# Patient Record
Sex: Female | Born: 1995 | Race: Black or African American | Hispanic: No | Marital: Single | Smoking: Never smoker
Health system: Southern US, Community
[De-identification: ages and names within clinical notes are randomized; demographics above are authoritative.]

---

## 2016-09-25 ENCOUNTER — Emergency Department (HOSPITAL_COMMUNITY)
Admission: EM | Admit: 2016-09-25 | Discharge: 2016-09-25 | Disposition: A | Discharge status: Home / Self Care | Payer: BLUE CROSS/BLUE SHIELD | Attending: Emergency Medicine | Admitting: Emergency Medicine

## 2016-09-25 ENCOUNTER — Encounter (HOSPITAL_COMMUNITY): Payer: Self-pay

## 2016-09-25 DIAGNOSIS — R519 Headache, unspecified: Secondary | ICD-10-CM

## 2016-09-25 DIAGNOSIS — Z79899 Other long term (current) drug therapy: Secondary | ICD-10-CM | POA: Insufficient documentation

## 2016-09-25 DIAGNOSIS — R52 Pain, unspecified: Secondary | ICD-10-CM | POA: Diagnosis present

## 2016-09-25 DIAGNOSIS — R51 Headache: Secondary | ICD-10-CM

## 2016-09-25 LAB — URINALYSIS, ROUTINE W REFLEX MICROSCOPIC
Bilirubin Urine: NEGATIVE
Glucose, UA: NEGATIVE mg/dL
KETONES UR: NEGATIVE mg/dL
LEUKOCYTES UA: NEGATIVE
Nitrite: NEGATIVE
Protein, ur: 30 mg/dL — AB
Specific Gravity, Urine: 1.029 (ref 1.005–1.030)
pH: 5 (ref 5.0–8.0)

## 2016-09-25 LAB — POC URINE PREG, ED: PREG TEST UR: NEGATIVE

## 2016-09-25 MED ORDER — LORAZEPAM 2 MG/ML IJ SOLN
1.0000 mg | Freq: Once | INTRAMUSCULAR | Status: AC
  Administered 2016-09-25: 1 mg via INTRAVENOUS
  Filled 2016-09-25: qty 1

## 2016-09-25 MED ORDER — DIPHENHYDRAMINE HCL 50 MG/ML IJ SOLN
25.0000 mg | Freq: Once | INTRAMUSCULAR | Status: AC
  Administered 2016-09-25: 25 mg via INTRAVENOUS
  Filled 2016-09-25: qty 1

## 2016-09-25 MED ORDER — SODIUM CHLORIDE 0.9 % IV BOLUS (SEPSIS)
1000.0000 mL | Freq: Once | INTRAVENOUS | Status: AC
  Administered 2016-09-25: 1000 mL via INTRAVENOUS

## 2016-09-25 MED ORDER — DEXAMETHASONE SODIUM PHOSPHATE 10 MG/ML IJ SOLN
10.0000 mg | Freq: Once | INTRAMUSCULAR | Status: AC
  Administered 2016-09-25: 10 mg via INTRAVENOUS
  Filled 2016-09-25: qty 1

## 2016-09-25 MED ORDER — PROCHLORPERAZINE EDISYLATE 5 MG/ML IJ SOLN
10.0000 mg | Freq: Once | INTRAMUSCULAR | Status: AC
  Administered 2016-09-25: 10 mg via INTRAVENOUS
  Filled 2016-09-25: qty 2

## 2016-09-25 NOTE — ED Provider Notes (Signed)
[facility]-EMERGENCY DEPT Provider Note   CSN: [id number] Arrival date & time: 09/25/16  1742     History   Chief Complaint Chief Complaint  Patient presents with  . Headache    HPI Kara Taylor is a 21 y.o. female.  The history is provided by the patient, a significant other and medical records.  Headache   This is a recurrent problem. The current episode started 2 days ago. The problem occurs constantly. The problem has not changed since onset.The headache is associated with the menstrual cycle, bright light and loud noise. The pain is located in the temporal region. The quality of the pain is described as dull. The pain is at a severity of 9/10. The pain is severe. The pain does not radiate. Pertinent negatives include no fever, no malaise/fatigue, no chest pressure, no near-syncope, no palpitations, no syncope, no shortness of breath, no nausea and no vomiting. She has tried NSAIDs for the symptoms. The treatment provided no relief.    History reviewed. No pertinent past medical history.  There are no active problems to display for this patient.   History reviewed. No pertinent surgical history.  OB History    No data available       Home Medications    Prior to Admission medications   Medication Sig Start Date End Date Taking? Authorizing Provider  ibuprofen (ADVIL,MOTRIN) 800 MG tablet Take 800 mg by mouth 2 (two) times daily as needed for headache. 07/09/16  Yes [provider]    Family History History reviewed. No pertinent family history.  Social History Social History  Substance Use Topics  . Smoking status: Never Smoker  . Smokeless tobacco: Never Used  . Alcohol use No     Allergies   Patient has no known allergies.   Review of Systems Review of Systems  Constitutional: Negative for activity change, chills, diaphoresis, fatigue, fever and malaise/fatigue.  HENT: Negative for congestion and rhinorrhea.   Eyes: Positive for photophobia.  Negative for visual disturbance.  Respiratory: Negative for cough, chest tightness, shortness of breath and stridor.   Cardiovascular: Negative for chest pain, palpitations, leg swelling, syncope and near-syncope.  Gastrointestinal: Negative for abdominal distention, abdominal pain, constipation, diarrhea, nausea and vomiting.  Genitourinary: Negative for difficulty urinating, dysuria, flank pain, frequency, hematuria, menstrual problem, pelvic pain, vaginal bleeding and vaginal discharge.  Musculoskeletal: Negative for back pain and neck pain.  Skin: Negative for rash and wound.  Neurological: Positive for headaches. Negative for dizziness, weakness, light-headedness and numbness.  Psychiatric/Behavioral: Negative for agitation and confusion.  All other systems reviewed and are negative.    Physical Exam Updated Vital Signs BP 131/85 (BP Location: Left Arm)   Pulse 98   Temp 98.7 F (37.1 C) (Oral)   Resp 18   LMP 09/18/2016   SpO2 100%   Physical Exam  Constitutional: She is oriented to person, place, and time. She appears well-developed and well-nourished. No distress.  HENT:  Head: Normocephalic and atraumatic.  Mouth/Throat: Oropharynx is clear and moist. No oropharyngeal exudate.  Eyes: Conjunctivae and EOM are normal. Pupils are equal, round, and reactive to light.  Neck: Normal range of motion. Neck supple.  Cardiovascular: Normal rate and regular rhythm.   No murmur heard. Pulmonary/Chest: Effort normal and breath sounds normal. No stridor. No respiratory distress. She has no wheezes. She exhibits no tenderness.  Abdominal: Soft. There is no tenderness.  Musculoskeletal: She exhibits no edema or tenderness.  Neurological: She is alert and oriented to person,  place, and time. She displays normal reflexes. No cranial nerve deficit or sensory deficit. She exhibits normal muscle tone. Coordination normal.  Skin: Skin is warm and dry. Capillary refill takes less than 2  seconds. No rash noted. She is not diaphoretic. No erythema.  Psychiatric: She has a normal mood and affect.  Nursing note and vitals reviewed.    ED Treatments / Results  Labs (all labs ordered are listed, but only abnormal results are displayed) Labs Reviewed  URINALYSIS, ROUTINE W REFLEX MICROSCOPIC - Abnormal; Notable for the following:       Result Value   Hgb urine dipstick SMALL (*)    Protein, ur 30 (*)    Bacteria, UA RARE (*)    Squamous Epithelial / LPF 0-5 (*)    All other components within normal limits  POC URINE PREG, ED    EKG  EKG Interpretation None       Radiology No results found.  Procedures Procedures (including critical care time)  Medications Ordered in ED Medications  sodium chloride 0.9 % bolus 1,000 mL (0 mLs Intravenous Stopped 09/25/16 2030)  prochlorperazine (COMPAZINE) injection 10 mg (10 mg Intravenous Given 09/25/16 1850)  diphenhydrAMINE (BENADRYL) injection 25 mg (25 mg Intravenous Given 09/25/16 1850)  dexamethasone (DECADRON) injection 10 mg (10 mg Intravenous Given 09/25/16 1850)  LORazepam (ATIVAN) injection 1 mg (1 mg Intravenous Given 09/25/16 1912)     Initial Impression / Assessment and Plan / ED Course  I have reviewed the triage vital signs and the nursing notes.  Pertinent labs & imaging results that were available during my care of the patient were reviewed by me and considered in my medical decision making (see chart for details).     Kara Taylor is a 21 y.o. female with a past medical history significant for headaches usually associated with menstrual cycles and presents with headache. Patient says that she is on the last day of her menstrual cycle currently. She reports that she has a headache for the last 3 days. She reports no nausea or vomiting. She describes the headache as 8 out of 10 severity and waxing and waning for the last 3 days. She denies any vision changes, palpitations, chest pain, shortness of breath.  She denies any fevers or chills. She denies any neck pain or neck stiffness. No recent head trauma. She denies any new medications aside from taking ibuprofen to help with her discomfort. She denies any weakness but does report some tingling in her extremity. She reports associated photophobia and phonophobia.  On exam, patient in no focal neurologic deficits. Pupils reactive bilaterally. Patient had no nuchal rigidity. Lungs were clear and abdomen nontender.  Suspect patient has migrainous headache causing her symptoms. Patient will have a pregnancy test and urinalysis. Patient will be given headache cocktail if preg test negative.   Reassessed after medications.             11:34 PM Patient reports that her headache is not completely resolved. Patient denies any other neurologic complaints. Patient is resting comfortable.  Pertinent test negative and urinalysis showed no evidence of infection.  Patient will be discharged with instructions to follow-up with PCP and return precautions. Patient understood this plan. Patient discharged in good condition with resolution of presenting symptoms.   Final Clinical Impressions(s) / ED Diagnoses   Final diagnoses:  Bad headache    New Prescriptions Discharge Medication List as of 09/25/2016 11:36 PM      Clinical Impression: 1. Bad headache  Disposition: Discharge  Condition: Good  I have discussed the results, Dx and Tx plan with the pt(& family if present). He/she/they expressed understanding and agree(s) with the plan. Discharge instructions discussed at great length. Strict return precautions discussed and pt &/or family have verbalized understanding of the instructions. No further questions at time of discharge.    Discharge Medication List as of 09/25/2016 11:36 PM      Follow Up: [facility] [address] [place] [id number] [phone] Schedule an appointment as soon  as possible for a visit    [facility] [address] [id number] [place] [phone]  If symptoms worsen     Zophia Marrone, Canary Brimhristopher J, MD 09/26/16 0010

## 2016-09-25 NOTE — ED Triage Notes (Signed)
Pt with headache x 3 days.  Now with tingling in arms and feet.  No gi symptoms.

## 2016-09-25 NOTE — Discharge Instructions (Signed)
Please get some rest. Please dehydrated. Please call and schedule appointment with primary care physician for further workup and management of her headaches. If any symptoms change or worsen, please return to the nearest emergency department.

## 2016-10-03 ENCOUNTER — Encounter (HOSPITAL_COMMUNITY): Payer: Self-pay

## 2016-10-03 ENCOUNTER — Emergency Department (HOSPITAL_COMMUNITY)
Admission: EM | Admit: 2016-10-03 | Discharge: 2016-10-03 | Disposition: A | Discharge status: Home / Self Care | Payer: BLUE CROSS/BLUE SHIELD | Attending: Emergency Medicine | Admitting: Emergency Medicine

## 2016-10-03 ENCOUNTER — Emergency Department (HOSPITAL_COMMUNITY): Payer: BLUE CROSS/BLUE SHIELD

## 2016-10-03 DIAGNOSIS — R0789 Other chest pain: Secondary | ICD-10-CM | POA: Diagnosis present

## 2016-10-03 DIAGNOSIS — Z79899 Other long term (current) drug therapy: Secondary | ICD-10-CM | POA: Diagnosis not present

## 2016-10-03 LAB — BASIC METABOLIC PANEL
ANION GAP: 7 (ref 5–15)
BUN: 9 mg/dL (ref 6–20)
CHLORIDE: 105 mmol/L (ref 101–111)
CO2: 26 mmol/L (ref 22–32)
Calcium: 9.4 mg/dL (ref 8.9–10.3)
Creatinine, Ser: 0.84 mg/dL (ref 0.44–1.00)
GFR calc Af Amer: >60 mL/min (ref >60)
GLUCOSE: 90 mg/dL (ref 65–99)
Potassium: 3.4 mmol/L — ABNORMAL LOW (ref 3.5–5.1)
Sodium: 138 mmol/L (ref 135–145)

## 2016-10-03 LAB — CBC
HEMATOCRIT: 34.3 % — AB (ref 36.0–46.0)
HEMOGLOBIN: 11.8 g/dL — AB (ref 12.0–15.0)
MCH: 29.4 pg (ref 26.0–34.0)
MCHC: 34.4 g/dL (ref 30.0–36.0)
MCV: 85.5 fL (ref 78.0–100.0)
Platelets: 262 10*3/uL (ref 150–400)
RBC: 4.01 MIL/uL (ref 3.87–5.11)
RDW: 13.9 % (ref 11.5–15.5)
WBC: 6.5 10*3/uL (ref 4.0–10.5)

## 2016-10-03 LAB — POCT PREGNANCY, URINE: Preg Test, Ur: NEGATIVE

## 2016-10-03 LAB — POCT I-STAT TROPONIN I: Troponin i, poc: 0 ng/mL (ref 0.00–0.08)

## 2016-10-03 LAB — D-DIMER, QUANTITATIVE (NOT AT ARMC)

## 2016-10-03 MED ORDER — KETOROLAC TROMETHAMINE 30 MG/ML IJ SOLN
15.0000 mg | Freq: Once | INTRAMUSCULAR | Status: AC
  Administered 2016-10-03: 15 mg via INTRAVENOUS
  Filled 2016-10-03: qty 1

## 2016-10-03 MED ORDER — GI COCKTAIL ~~LOC~~
30.0000 mL | Freq: Once | ORAL | Status: AC
  Administered 2016-10-03: 30 mL via ORAL
  Filled 2016-10-03: qty 30

## 2016-10-03 NOTE — ED Triage Notes (Signed)
Patient states she has had chest pain x 2 days and went to an UC. Patient states she was told she had mild hyperinflation of lungs. Patient wanted to be seen because she was continuing to have pain.

## 2016-10-03 NOTE — Discharge Instructions (Signed)
All of your lab work and imaging has been reassuring. No signs of blood clot or heart attack. May use over-the-counter acid reducer such as Prilosec or Nexium for heartburn. Please take NSAID such as ibuprofen and Aleve and Tylenol for pain. Failure constipation and he may use over-the-counter MiraLAX and stool softener.

## 2016-10-03 NOTE — ED Provider Notes (Signed)
[facility]-EMERGENCY DEPT Provider Note   CSN: [id number] Arrival date & time: 10/03/16  1239     History   Chief Complaint Chief Complaint  Patient presents with  . Chest Pain    HPI Kara Taylor is a 21 y.o. female.  HPI 21 year old African-American female with no significant past medical history presents to the emergency Department today with complaints of ongoing chest pain. Patient states that she has had ongoing "chest tightness" in the center of her chest that radiates to the left side for the past 2 days and has been constant. Nothing makes better or worse. Patient did not try anything for her symptoms at home. Went to urgent care yesterday and had a negative workup at that time. She was told she had mild hyperinflation of lungs and to follow-up with her PCP. Patient seen today for ongoing symptoms. She endorses mild shortness of breath that she cannot take a deep breath. Denies any pain with exertion. Nonpleuritic in nature. Patient states she is very anxious and family at bedside states the patient is stressed and anxious. Patient states she wants to know why her chest is hurting. States she didn't come in to the ED for evaluation of migraine approximately one week ago and was given migraine cocktail. Didn't know if this could be caused by this medications. Patient denies any cardiac history. She denies any history of PE/DVT, OCP use, lower extremity edema or calf tenderness, prolonged immobilizations, recent hospitalizations or surgeries. Patient denies any fever, recent illness, cough, lightheadedness, dizziness, nausea, vomiting, diaphoresis, urinary symptoms, paresthesias. History reviewed. No pertinent past medical history.  There are no active problems to display for this patient.   History reviewed. No pertinent surgical history.  OB History    No data available       Home Medications    Prior to Admission medications   Medication Sig Start Date End Date Taking?  Authorizing Provider  ibuprofen (ADVIL,MOTRIN) 800 MG tablet Take 800 mg by mouth 2 (two) times daily as needed for headache. 07/09/16   [provider]    Family History History reviewed. No pertinent family history.  Social History Social History  Substance Use Topics  . Smoking status: Never Smoker  . Smokeless tobacco: Never Used  . Alcohol use No     Allergies   Patient has no known allergies.   Review of Systems Review of Systems  Constitutional: Negative for chills and fever.  HENT: Negative for congestion.   Eyes: Negative for visual disturbance.  Respiratory: Positive for shortness of breath. Negative for cough.   Cardiovascular: Positive for chest pain. Negative for palpitations and leg swelling.  Gastrointestinal: Negative for abdominal pain, diarrhea, nausea and vomiting.  Genitourinary: Negative for dysuria, flank pain, frequency, hematuria and urgency.  Skin: Negative.   Neurological: Negative for dizziness, syncope, weakness, light-headedness and headaches.  Psychiatric/Behavioral: The patient is nervous/anxious.      Physical Exam Updated Vital Signs BP 131/86   Pulse 98   Temp 98.2 F (36.8 C) (Oral)   Resp 15   Ht 5' 5.5" (1.664 m)   Wt 56.2 kg (124 lb)   LMP 09/18/2016   SpO2 100%   BMI 20.32 kg/m   Physical Exam  Constitutional: She is oriented to person, place, and time. She appears well-developed and well-nourished. No distress.  Patient appears anxious but is nontoxic appearing. Appears in no acute distress resting on the bed comfortably.  HENT:  Head: Normocephalic and atraumatic.  Mouth/Throat: Oropharynx is clear  and moist.  Eyes: Conjunctivae are normal. Right eye exhibits no discharge. Left eye exhibits no discharge. No scleral icterus.  Neck: Normal range of motion. Neck supple. No thyromegaly present.  Cardiovascular: Regular rhythm, normal heart sounds and intact distal pulses.  Exam reveals no gallop and no friction rub.    No murmur heard. Hr 105  Pulmonary/Chest: Effort normal and breath sounds normal. No respiratory distress. She has no wheezes. She has no rales. She exhibits tenderness.  No hypoxia or tachpnea   Abdominal: Soft. Bowel sounds are normal. She exhibits no distension. There is no tenderness. There is no rebound and no guarding.  Musculoskeletal: Normal range of motion. She exhibits no edema.  No lower extremity edema or calf tenderness.  Lymphadenopathy:    She has no cervical adenopathy.  Neurological: She is alert and oriented to person, place, and time.  Skin: Skin is warm and dry. Capillary refill takes less than 2 seconds.  Nursing note and vitals reviewed.    ED Treatments / Results  Labs (all labs ordered are listed, but only abnormal results are displayed) Labs Reviewed  BASIC METABOLIC PANEL - Abnormal; Notable for the following:       Result Value   Potassium 3.4 (*)    All other components within normal limits  CBC - Abnormal; Notable for the following:    Hemoglobin 11.8 (*)    HCT 34.3 (*)    All other components within normal limits  D-DIMER, QUANTITATIVE (NOT AT [facility])  Rosezena SensorI-STAT TROPOININ, ED  POCT I-STAT TROPONIN I  POC URINE PREG, ED  POCT PREGNANCY, URINE    EKG  EKG Interpretation None       Radiology Dg Chest 2 View  Result Date: 10/03/2016 CLINICAL DATA:  Chest pain. EXAM: CHEST  2 VIEW COMPARISON:  None. FINDINGS: The heart size and mediastinal contours are within normal limits. Both lungs are clear. No pneumothorax or pleural effusion is noted. The visualized skeletal structures are unremarkable. IMPRESSION: No active cardiopulmonary disease. Electronically Signed   By: Lupita RaiderJames  Green Jr, M.D.   On: 10/03/2016 13:43    Procedures Procedures (including critical care time)  Medications Ordered in ED Medications  ketorolac (TORADOL) 30 MG/ML injection 15 mg (not administered)  gi cocktail (Maalox,Lidocaine,Donnatal) (not administered)      Initial Impression / Assessment and Plan / ED Course  I have reviewed the triage vital signs and the nursing notes.  Pertinent labs & imaging results that were available during my care of the patient were reviewed by me and considered in my medical decision making (see chart for details).     Patient is to be discharged with recommendation to follow up with PCP in regards to today's hospital visit. Chest pain is not likely of cardiac or pulmonary etiology d/t presentation, d dimer negative, VSS, no tracheal deviation, no JVD or new murmur, RRR, breath sounds equal bilaterally, EKG without signs of ishcemia, negative troponin, and negative CXR. Symptoms ongoing for 2 days do not feel that delta trop is negative. All other labs unremarkable. Pt has been advised start a PPI and return to the ED is CP becomes exertional, associated with diaphoresis or nausea, radiates to left jaw/arm, worsens or becomes concerning in any way. Patient reports constipation that she is having a bowel movement which may be causing her symptoms. She feels much improved after medicine. Encouraged over-the-counter MiraLAX and stool softener. Patient is nontoxic appearing. Vital signs remained stable. Pt appears reliable for follow up and  is agreeable to discharge.   Dicussed with Dr. Eudelia Bunch.    Final Clinical Impressions(s) / ED Diagnoses   Final diagnoses:  Atypical chest pain    New Prescriptions New Prescriptions   No medications on file     Wallace Keller 10/03/16 1605    Nira Conn, MD 10/04/16 [phone]

## 2016-10-03 NOTE — ED Notes (Signed)
Bed: [id number] Expected date:  Expected time:  Means of arrival:  Comments: SMELL

## 2018-05-14 IMAGING — CR DG CHEST 2V
2 series · 2 of 2 positions shown · non-contrast
Comparison: None.

CLINICAL DATA: Chest pain.

EXAM:
CHEST  2 VIEW

[w chest pa]
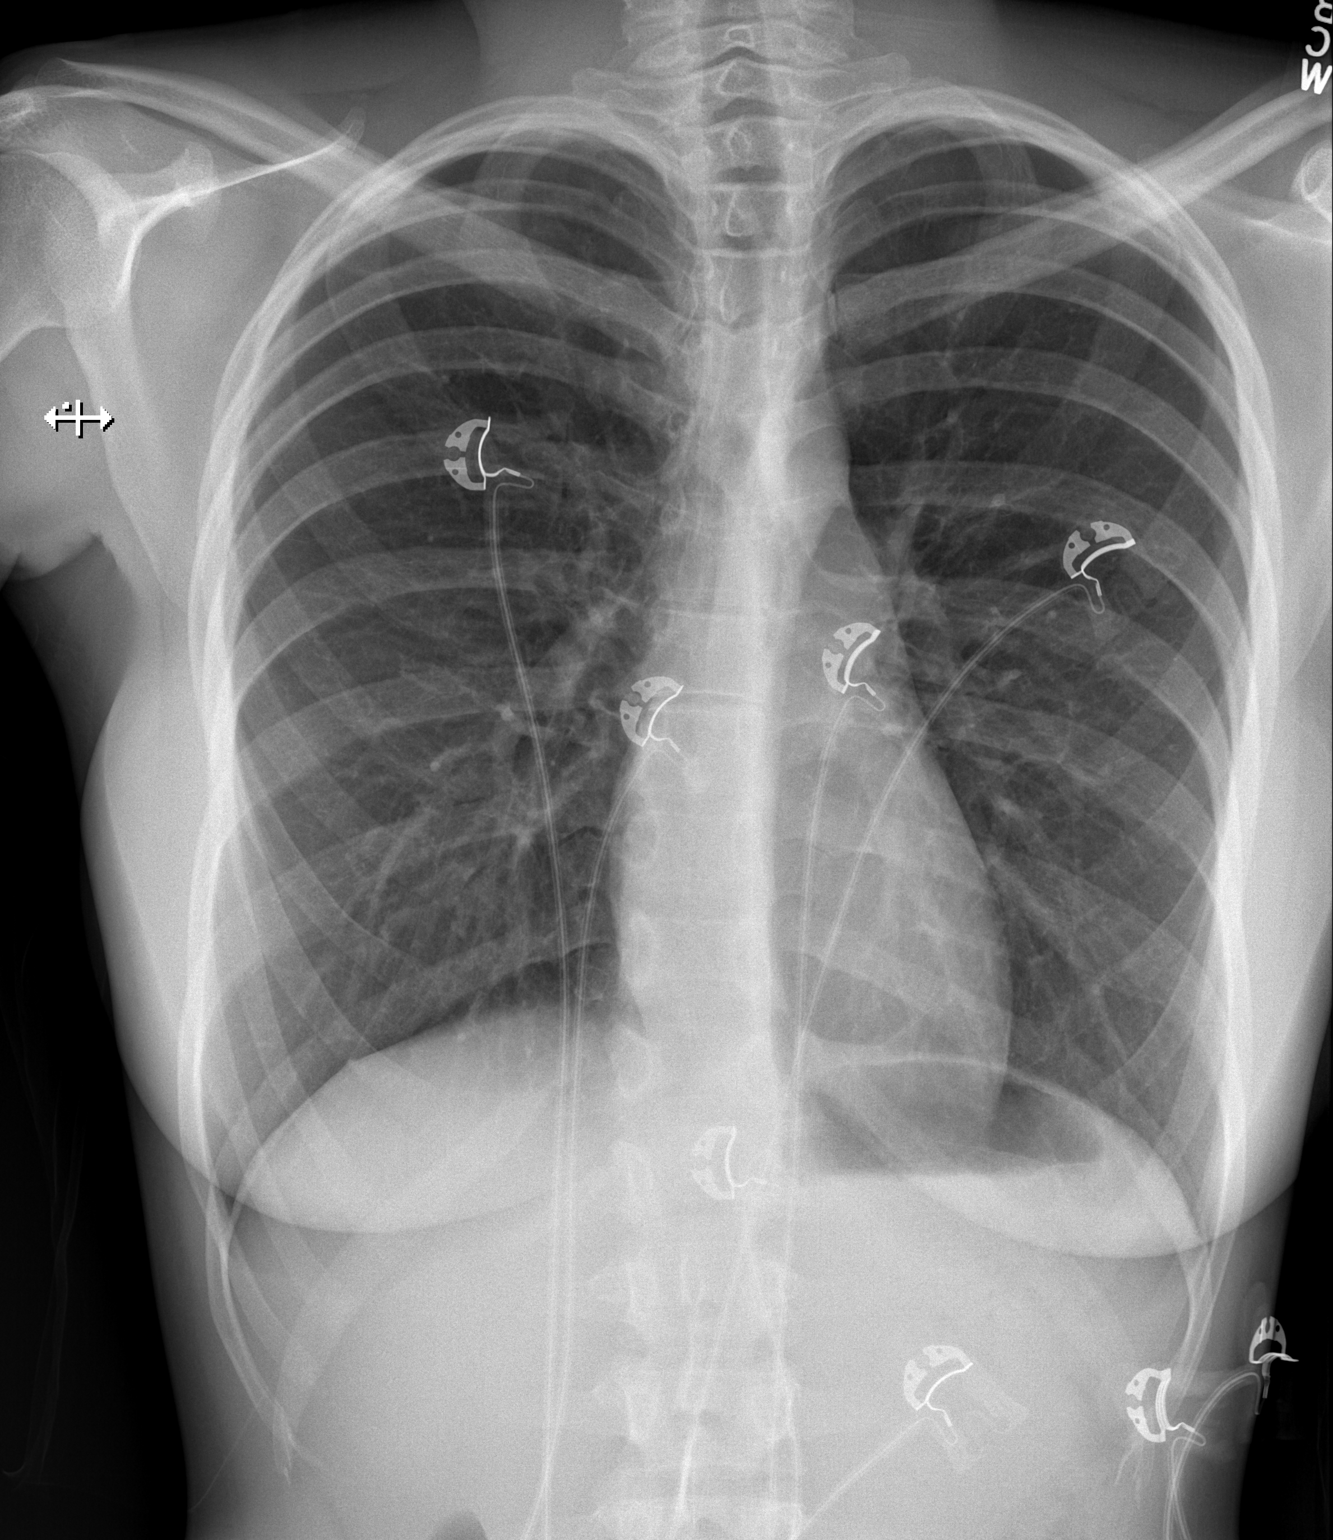

[w chest lat]
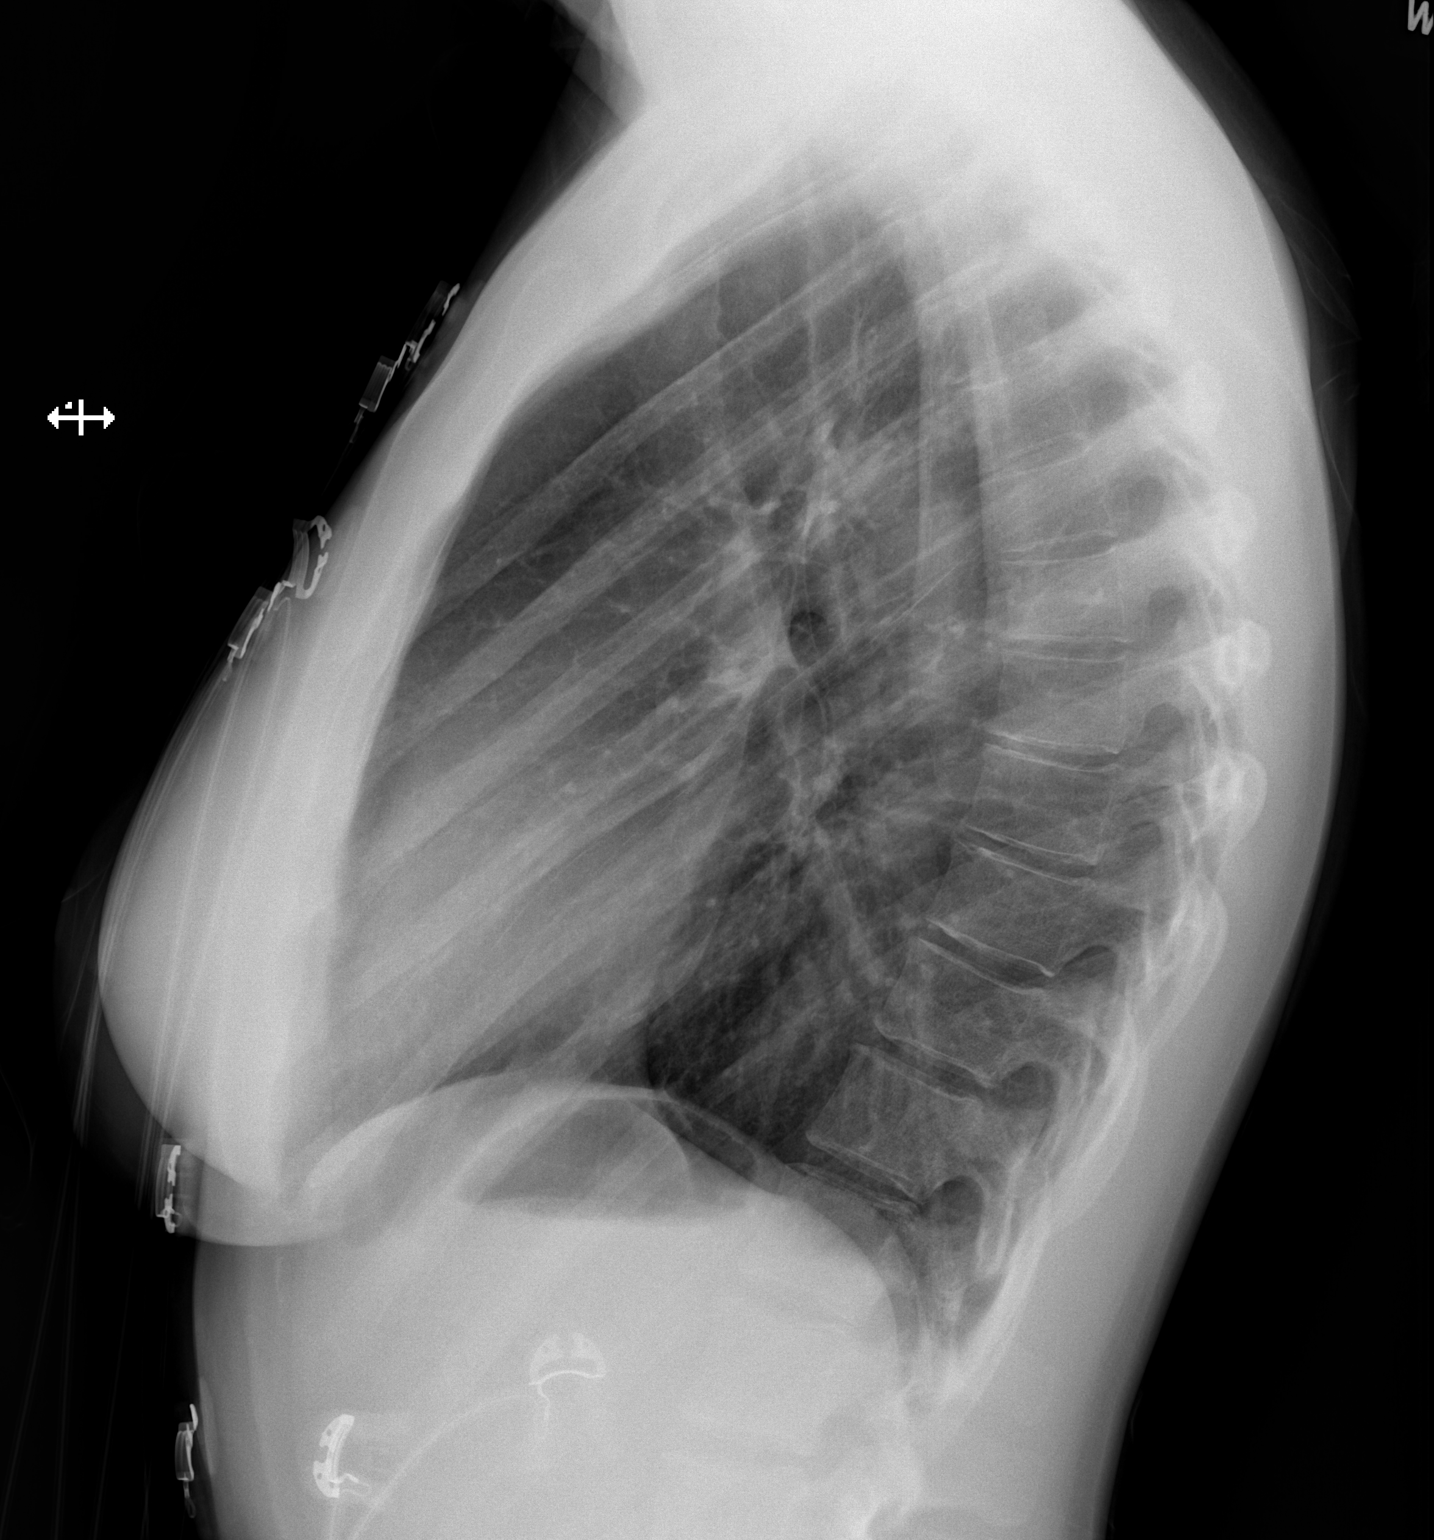

[2 of 2 positions shown; findings below may reference images not displayed]

FINDINGS: The heart size and mediastinal contours are within normal limits.
Both lungs are clear. No pneumothorax or pleural effusion is noted.
The visualized skeletal structures are unremarkable.
IMPRESSION: No active cardiopulmonary disease.
